# Patient Record
Sex: Male | Born: 1965 | Race: White | Hispanic: No | Marital: Single
Health system: Southern US, Community
[De-identification: ages and names within clinical notes are randomized; demographics above are authoritative.]

---

## 2016-11-29 ENCOUNTER — Emergency Department: Payer: Self-pay

## 2016-11-29 DIAGNOSIS — J069 Acute upper respiratory infection, unspecified: Secondary | ICD-10-CM | POA: Insufficient documentation

## 2016-11-29 DIAGNOSIS — J0100 Acute maxillary sinusitis, unspecified: Secondary | ICD-10-CM | POA: Insufficient documentation

## 2016-11-29 NOTE — ED Notes (Signed)
Pt ambulatory to stat desk, report cold sx x 2 weeks, states awoke w/ SOB, coughing, and vomited, c/o throbbing pain to right side of face and ear.  Pt NAD at this time.

## 2016-11-29 NOTE — ED Triage Notes (Addendum)
Pt ambulatory to triage with no difficulty. Pt reports URI sx for about 2 weeks. Started with a sore throat then sinus drainage and a cough. Tonight cough is worse and pt reports he coughed so hard he vomited. Pt also reports having a headache. Pt using OTC meds with only min. Relief. Pt took nyquil and 2 BC powders around 6 pm

## 2016-11-30 ENCOUNTER — Emergency Department
Admission: EM | Admit: 2016-11-30 | Discharge: 2016-11-30 | Disposition: A | Discharge status: Home / Self Care | Payer: Self-pay | Attending: Emergency Medicine | Admitting: Emergency Medicine

## 2016-11-30 DIAGNOSIS — J0100 Acute maxillary sinusitis, unspecified: Secondary | ICD-10-CM

## 2016-11-30 DIAGNOSIS — J069 Acute upper respiratory infection, unspecified: Secondary | ICD-10-CM

## 2016-11-30 MED ORDER — BENZONATATE 100 MG PO CAPS
200.0000 mg | ORAL_CAPSULE | Freq: Once | ORAL | Status: AC
  Administered 2016-11-30: 200 mg via ORAL
  Filled 2016-11-30: qty 2

## 2016-11-30 MED ORDER — BENZONATATE 100 MG PO CAPS: 100.0000 mg | ORAL_CAPSULE | Freq: Four times a day (QID) | ORAL | 0 refills | Status: AC | PRN

## 2016-11-30 MED ORDER — AMOXICILLIN-POT CLAVULANATE 875-125 MG PO TABS
1.0000 | ORAL_TABLET | Freq: Once | ORAL | Status: AC
  Administered 2016-11-30: 1 via ORAL
  Filled 2016-11-30: qty 1

## 2016-11-30 MED ORDER — AZITHROMYCIN 500 MG PO TABS: 500.0000 mg | ORAL_TABLET | Freq: Every day | ORAL | 0 refills | Status: AC

## 2016-11-30 NOTE — ED Provider Notes (Signed)
[facility] Emergency Department Provider Note   First MD Initiated Contact with Patient 11/30/16 0033     (approximate)  I have reviewed the triage vital signs and the nursing notes.   HISTORY  Chief Complaint Sore Throat; Cough; and Headache   HPI Darryl Avery is a 50 y.o. male presents with 2 week history of nonproductive cough. Patient states that he awoke tonight with coughing and shortness of breath with 1 episode of posttussive emesis. Patient also admits to right ear pain   Past medical history None There are no active problems to display for this patient.   Past surgical history None  Prior to Admission medications   Medication Sig Start Date End Date Taking? Authorizing Provider  azithromycin (ZITHROMAX) 500 MG tablet Take 1 tablet (500 mg total) by mouth daily. Take 1 tablet daily for 3 days. 11/30/16 12/03/16  Darci Currentandolph N Yuchen Fedor, MD  benzonatate (TESSALON PERLES) 100 MG capsule Take 1 capsule (100 mg total) by mouth every 6 (six) hours as needed for cough. 11/30/16 11/30/17  Darci Currentandolph N Kennis Buell, MD    Allergies No known drug allergies  No family history on file.  Social History Social History  Substance Use Topics  . Smoking status: Not on file  . Smokeless tobacco: Not on file  . Alcohol use Not on file    Review of Systems Constitutional: No fever/chills Eyes: No visual changes. ENT: No sore throat.Positive for right earache Cardiovascular: Denies chest pain. Respiratory: Denies shortness of breath.Positive for cough Gastrointestinal: No abdominal pain.  No nausea, no vomiting.  No diarrhea.  No constipation. Genitourinary: Negative for dysuria. Musculoskeletal: Negative for back pain. Skin: Negative for rash. Neurological: Negative for headaches, focal weakness or numbness.  10-point ROS otherwise negative.  ____________________________________________   PHYSICAL EXAM:  VITAL SIGNS: ED Triage Vitals  Enc Vitals  Group     BP 11/29/16 2327 128/85     Pulse Rate 11/29/16 2327 92     Resp 11/29/16 2327 18     Temp 11/29/16 2327 98.2 F (36.8 C)     Temp Source 11/29/16 2327 Oral     SpO2 11/29/16 2327 95 %     Weight 11/29/16 2328 230 lb (104.3 kg)     Height 11/29/16 2328 6\' 3"  (1.905 m)     Head Circumference --      Peak Flow --      Pain Score 11/29/16 2328 9     Pain Loc --      Pain Edu? --      Excl. in GC? --     Constitutional: Alert and oriented. Well appearing and in no acute distress. Eyes: Conjunctivae are normal. PERRL. EOMI. Head: Atraumatic. Ears:  Healthy appearing ear canals and TMs bilaterally Nose: No congestion/rhinnorhea. Pain with right maxillary sinus palpation Mouth/Throat: Mucous membranes are moist.  Oropharynx non-erythematous. Neck: No stridor.  No meningeal signs.   Cardiovascular: Normal rate, regular rhythm. Good peripheral circulation. Grossly normal heart sounds. Respiratory: Normal respiratory effort.  No retractions. Lungs CTAB. Gastrointestinal: Soft and nontender. No distention.  Musculoskeletal: No lower extremity tenderness nor edema. No gross deformities of extremities. Neurologic:  Normal speech and language. No gross focal neurologic deficits are appreciated.  Skin:  Skin is warm, dry and intact. No rash noted.    RADIOLOGY I, Somers N Dysen Edmondson, personally viewed and evaluated these images (plain radiographs) as part of my medical decision making, as well as reviewing the written report by  the radiologist.**}  Dg Chest 2 View  Result Date: 11/30/2016 CLINICAL DATA:  50 y/o  M; cough. EXAM: CHEST  2 VIEW COMPARISON:  None. FINDINGS: Normal cardiac silhouette. Clear lungs. No pleural effusion or pneumothorax. Bones are unremarkable. IMPRESSION: No active cardiopulmonary disease. Electronically Signed   By: Mitzi HansenLance  Furusawa-Stratton M.D.   On: 11/30/2016 00:06     Procedures     INITIAL IMPRESSION / ASSESSMENT AND PLAN / ED  COURSE  Pertinent labs & imaging results that were available during my care of the patient were reviewed by me and considered in my medical decision making (see chart for details).  Patient given him Augmentin and Tessalon Perles in the emergency department will prescribe same for home for suspected maxillary sinusitis   Clinical Course     ____________________________________________  FINAL CLINICAL IMPRESSION(S) / ED DIAGNOSES  Final diagnoses:  Upper respiratory tract infection, unspecified type  Acute non-recurrent maxillary sinusitis     MEDICATIONS GIVEN DURING THIS VISIT:  Medications  amoxicillin-clavulanate (AUGMENTIN) 875-125 MG per tablet 1 tablet (1 tablet Oral Given 11/30/16 0128)  benzonatate (TESSALON) capsule 200 mg (200 mg Oral Given 11/30/16 0128)     NEW OUTPATIENT MEDICATIONS STARTED DURING THIS VISIT:  Discharge Medication List as of 11/30/2016  1:23 AM    START taking these medications   Details  azithromycin (ZITHROMAX) 500 MG tablet Take 1 tablet (500 mg total) by mouth daily. Take 1 tablet daily for 3 days., Starting Sat 11/30/2016, Until Tue 12/03/2016, Print    benzonatate (TESSALON PERLES) 100 MG capsule Take 1 capsule (100 mg total) by mouth every 6 (six) hours as needed for cough., Starting Sat 11/30/2016, Until Sun 11/30/2017, Print        Discharge Medication List as of 11/30/2016  1:23 AM      Discharge Medication List as of 11/30/2016  1:23 AM       Note:  This document was prepared using Dragon voice recognition software and may include unintentional dictation errors.    Darci Currentandolph N Indigo Barbian, MD 11/30/16 [phone]

## 2016-11-30 NOTE — ED Notes (Signed)
Pt states "the entire side or my right head is in extreme pain." pt complains of sore throat, cough, post tussive emesis x1 today. Pt states has "extreme" right ear pain, no drainage noted. Pt denies known fever or chills. Skin normal color warm and dry.

## 2017-07-11 ENCOUNTER — Ambulatory Visit
Admission: RE | Admit: 2017-07-11 | Discharge: 2017-07-11 | Disposition: A | Discharge status: Home / Self Care | Payer: PRIVATE HEALTH INSURANCE | Source: Ambulatory Visit | Attending: Orthopaedic Surgery | Admitting: Orthopaedic Surgery

## 2017-07-11 ENCOUNTER — Other Ambulatory Visit: Payer: Self-pay | Admitting: Orthopaedic Surgery

## 2017-07-11 DIAGNOSIS — S62111S Displaced fracture of triquetrum [cuneiform] bone, right wrist, sequela: Secondary | ICD-10-CM | POA: Insufficient documentation

## 2017-07-11 DIAGNOSIS — S62034S Nondisplaced fracture of proximal third of navicular [scaphoid] bone of right wrist, sequela: Secondary | ICD-10-CM

## 2017-07-11 DIAGNOSIS — X58XXXS Exposure to other specified factors, sequela: Secondary | ICD-10-CM | POA: Insufficient documentation

## 2018-02-23 IMAGING — CT CT WRIST*R* W/O CM
3 of 4 series · 10 of 35 positions shown, 12 images · non-contrast
Comparison: None.

CLINICAL DATA: Right wrist fracture.  The patient fell 1 week ago.

EXAM:
CT OF THE RIGHT WRIST WITHOUT CONTRAST
TECHNIQUE: Multidetector CT imaging of the right wrist was performed according
to the standard protocol. Multiplanar CT image reconstructions were
also generated.

[Series 6: cor bone · coronal · 0.27mm/px · 1 of 86 slices shown]
[im 43/86  bone]
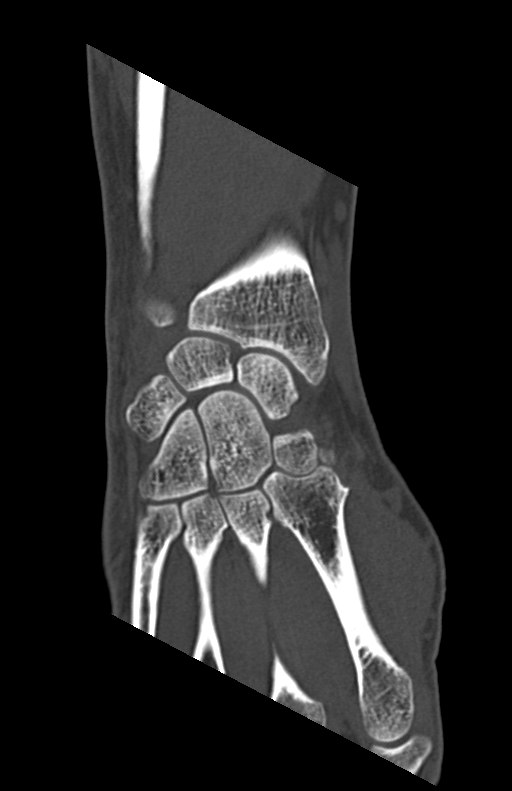

[Series 8: axial st · axial · 0.19mm/px · z∈[-70,+50]mm · 4 of 198 slices shown, 5 images]
[im 31/198  soft-tissue]
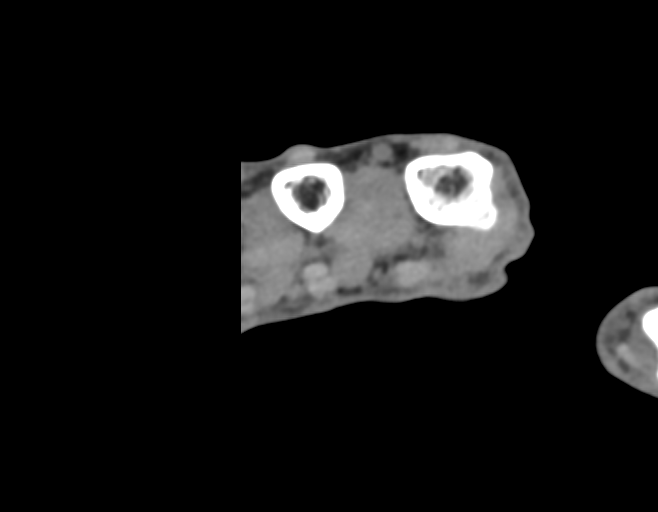
[im 31/198  bone]
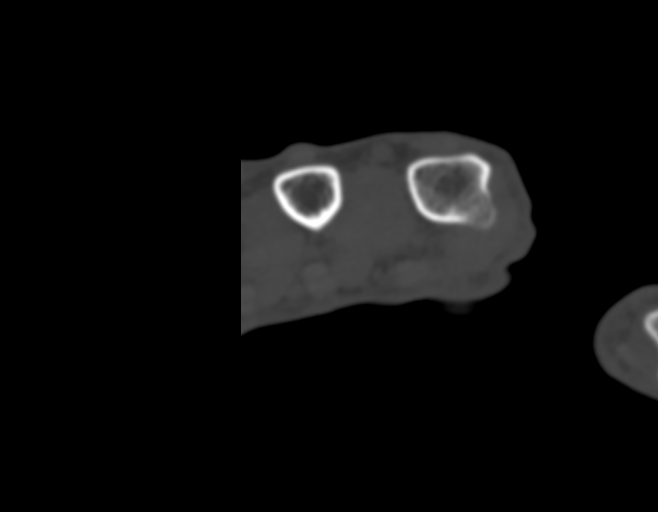
[im 76/198  bone]
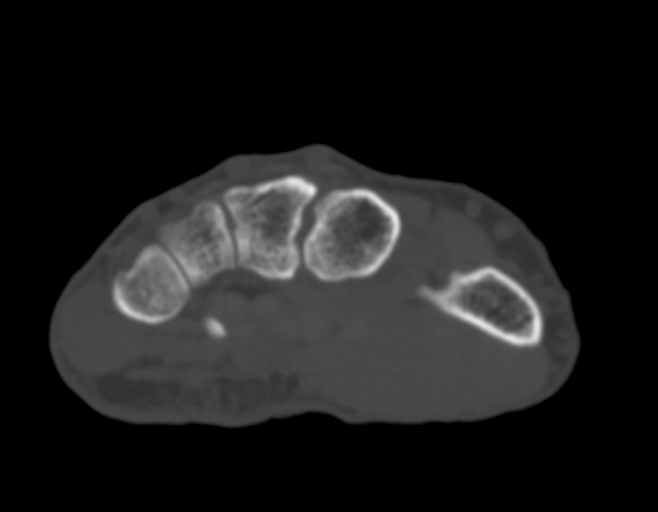
[im 122/198  bone]
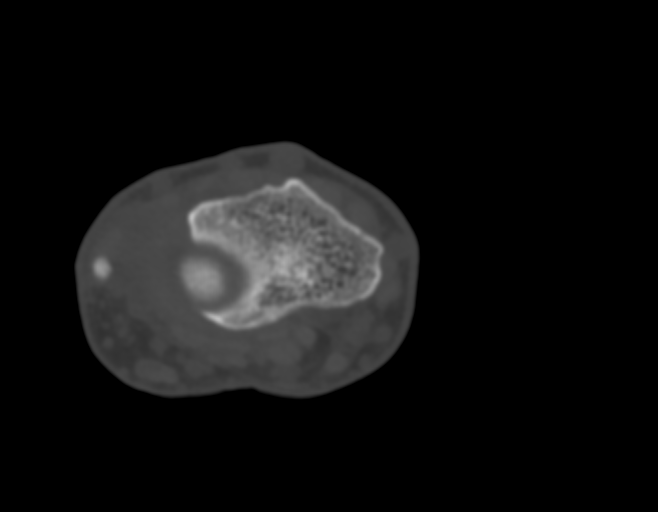
[im 167/198  bone]
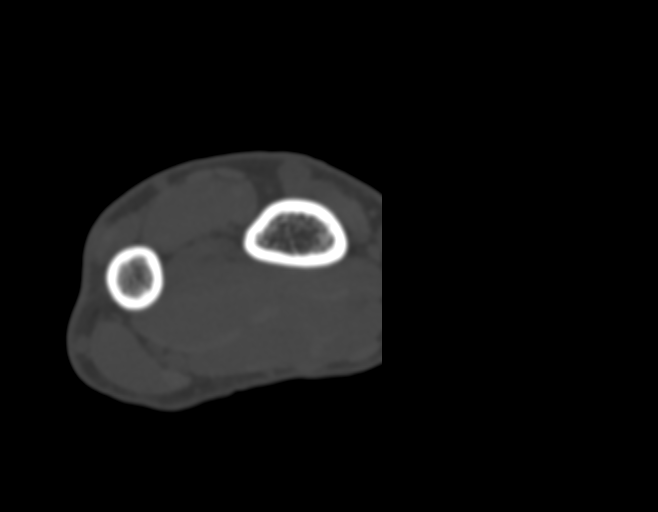

[Series 12: sag soft tissue · sagittal · 0.19mm/px · 5 of 105 slices shown, 6 images]
[im 35/105  bone]
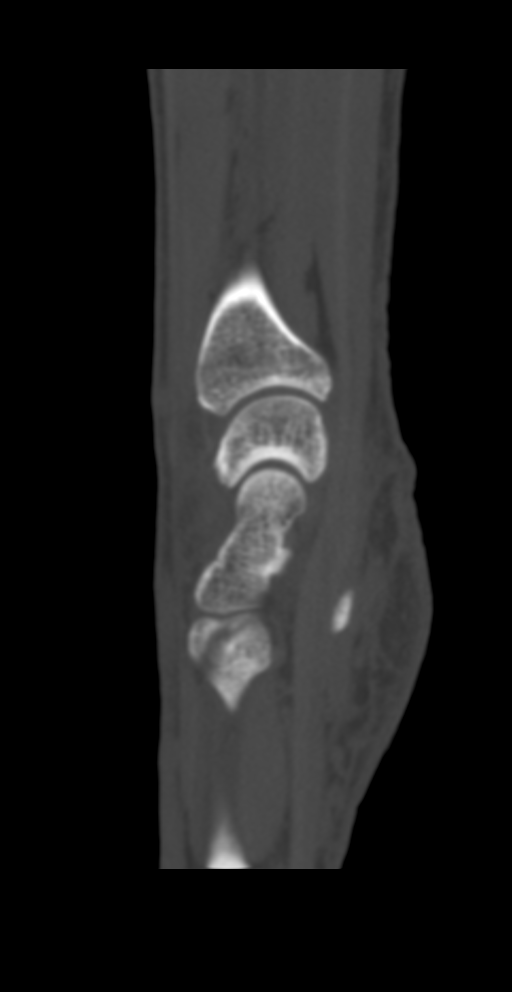
[im 44/105  bone]
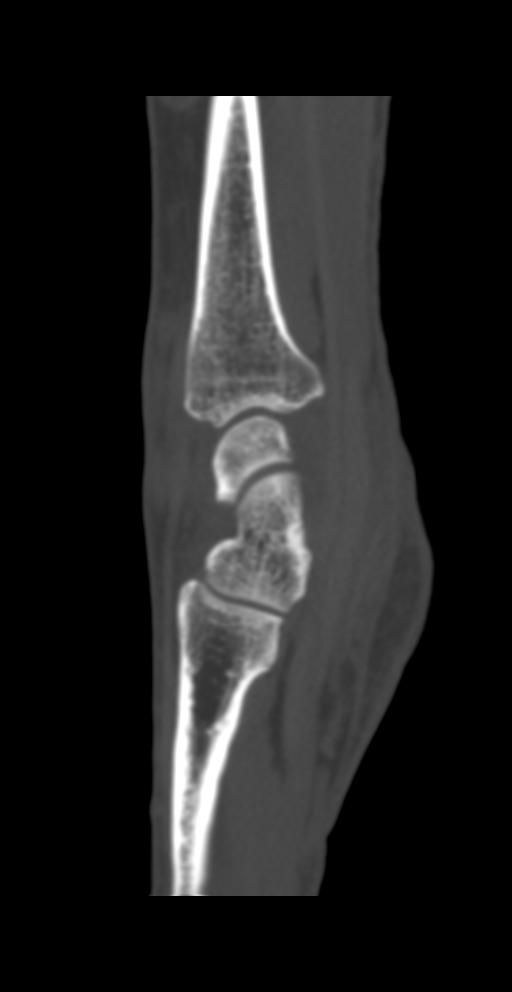
[im 53/105  soft-tissue]
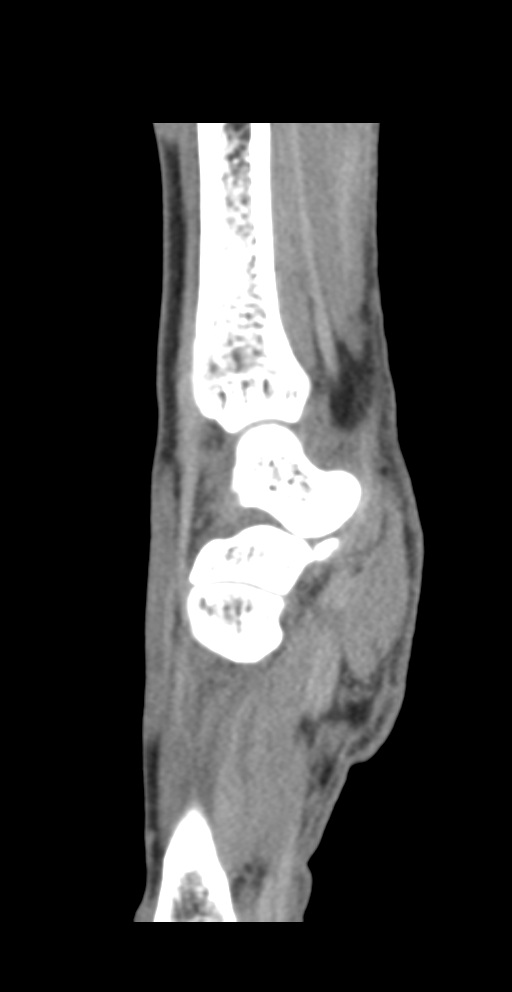
[im 53/105  bone]
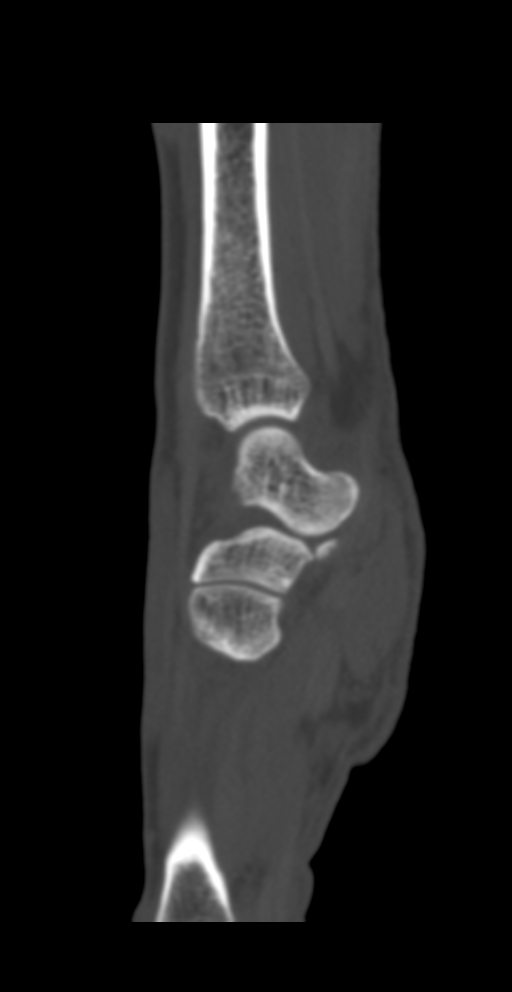
[im 61/105  bone]
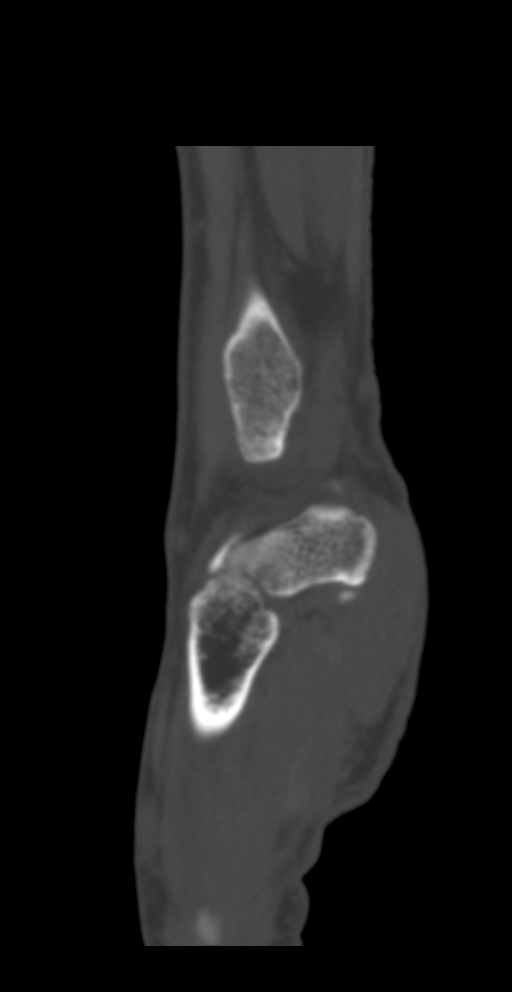
[im 70/105  bone]
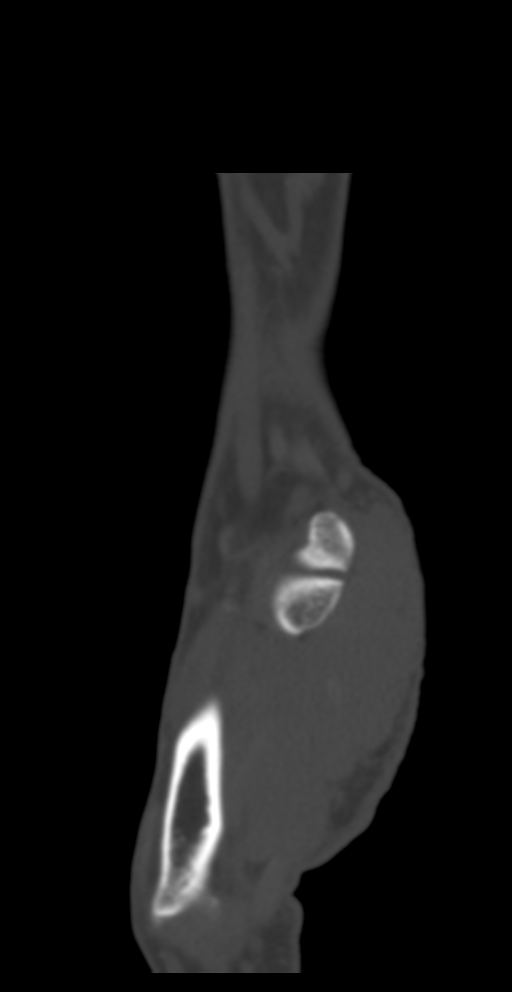

[10 of 35 positions shown; findings below may reference images not displayed]

FINDINGS: Bones/Joint/Cartilage

There is a slightly comminuted avulsion fracture from the dorsal
aspect of the triquetrum. The other carpal bones are intact. Distal
radius and ulna and proximal metacarpals are intact. No other
abnormalities. Normal alignment of the carpal bones.

Ligaments

Suboptimally assessed by CT.  No abnormal joint space widening.

Muscles and Tendons

Normal.
IMPRESSION: Slightly comminuted avulsion fracture of the dorsal aspect of the
triquetrum.
